# Patient Record
Sex: Female | Born: 1960 | Race: Black or African American | Hispanic: No | Marital: Married | Smoking: Current every day smoker
Health system: Southern US, Community
[De-identification: ages and names within clinical notes are randomized; demographics above are authoritative.]

## PROBLEM LIST (undated history)

## (undated) DIAGNOSIS — E119 Type 2 diabetes mellitus without complications: Secondary | ICD-10-CM

## (undated) HISTORY — PX: ABDOMINAL HYSTERECTOMY: SHX81

## (undated) HISTORY — PX: LIVER BIOPSY: SHX301

---

## 2005-08-10 ENCOUNTER — Ambulatory Visit: Payer: Self-pay | Admitting: Gastroenterology

## 2005-09-09 ENCOUNTER — Ambulatory Visit (HOSPITAL_COMMUNITY): Admission: RE | Admit: 2005-09-09 | Discharge: 2005-09-09 | Payer: Self-pay | Admitting: Gastroenterology

## 2006-09-08 ENCOUNTER — Ambulatory Visit: Payer: Self-pay | Admitting: Gastroenterology

## 2006-09-19 ENCOUNTER — Ambulatory Visit (HOSPITAL_COMMUNITY): Admission: RE | Admit: 2006-09-19 | Discharge: 2006-09-19 | Payer: Self-pay | Admitting: Gastroenterology

## 2013-03-20 ENCOUNTER — Emergency Department (HOSPITAL_BASED_OUTPATIENT_CLINIC_OR_DEPARTMENT_OTHER): Payer: No Typology Code available for payment source

## 2013-03-20 ENCOUNTER — Emergency Department (HOSPITAL_BASED_OUTPATIENT_CLINIC_OR_DEPARTMENT_OTHER)
Admission: EM | Admit: 2013-03-20 | Discharge: 2013-03-20 | Disposition: A | Discharge status: Home / Self Care | Payer: No Typology Code available for payment source | Attending: Emergency Medicine | Admitting: Emergency Medicine

## 2013-03-20 ENCOUNTER — Encounter (HOSPITAL_BASED_OUTPATIENT_CLINIC_OR_DEPARTMENT_OTHER): Payer: Self-pay | Admitting: *Deleted

## 2013-03-20 DIAGNOSIS — Y92410 Unspecified street and highway as the place of occurrence of the external cause: Secondary | ICD-10-CM | POA: Insufficient documentation

## 2013-03-20 DIAGNOSIS — S139XXA Sprain of joints and ligaments of unspecified parts of neck, initial encounter: Secondary | ICD-10-CM | POA: Insufficient documentation

## 2013-03-20 DIAGNOSIS — Z794 Long term (current) use of insulin: Secondary | ICD-10-CM | POA: Insufficient documentation

## 2013-03-20 DIAGNOSIS — Z79899 Other long term (current) drug therapy: Secondary | ICD-10-CM | POA: Insufficient documentation

## 2013-03-20 DIAGNOSIS — S46911A Strain of unspecified muscle, fascia and tendon at shoulder and upper arm level, right arm, initial encounter: Secondary | ICD-10-CM

## 2013-03-20 DIAGNOSIS — IMO0002 Reserved for concepts with insufficient information to code with codable children: Secondary | ICD-10-CM | POA: Insufficient documentation

## 2013-03-20 DIAGNOSIS — Y9389 Activity, other specified: Secondary | ICD-10-CM | POA: Insufficient documentation

## 2013-03-20 DIAGNOSIS — E119 Type 2 diabetes mellitus without complications: Secondary | ICD-10-CM | POA: Insufficient documentation

## 2013-03-20 DIAGNOSIS — S161XXA Strain of muscle, fascia and tendon at neck level, initial encounter: Secondary | ICD-10-CM

## 2013-03-20 DIAGNOSIS — F17200 Nicotine dependence, unspecified, uncomplicated: Secondary | ICD-10-CM | POA: Insufficient documentation

## 2013-03-20 HISTORY — DX: Type 2 diabetes mellitus without complications: E11.9

## 2013-03-20 MED ORDER — OXYCODONE-ACETAMINOPHEN 5-325 MG PO TABS: 1.0000 | ORAL_TABLET | Freq: Four times a day (QID) | ORAL | Status: DC | PRN

## 2013-03-20 MED ORDER — DIAZEPAM 5 MG PO TABS: 5.0000 mg | ORAL_TABLET | Freq: Four times a day (QID) | ORAL | Status: DC | PRN

## 2013-03-20 MED ORDER — IBUPROFEN 600 MG PO TABS: 600.0000 mg | ORAL_TABLET | Freq: Four times a day (QID) | ORAL | Status: DC | PRN

## 2013-03-20 MED ORDER — OXYCODONE-ACETAMINOPHEN 5-325 MG PO TABS
1.0000 | ORAL_TABLET | Freq: Once | ORAL | Status: AC
  Administered 2013-03-20: 1 via ORAL
  Filled 2013-03-20 (×2): qty 1
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
## 2013-06-04 ENCOUNTER — Emergency Department (HOSPITAL_BASED_OUTPATIENT_CLINIC_OR_DEPARTMENT_OTHER)
Admission: EM | Admit: 2013-06-04 | Discharge: 2013-06-04 | Disposition: A | Discharge status: Home / Self Care | Payer: Medicaid Other | Attending: Emergency Medicine | Admitting: Emergency Medicine

## 2013-06-04 ENCOUNTER — Emergency Department (HOSPITAL_BASED_OUTPATIENT_CLINIC_OR_DEPARTMENT_OTHER): Payer: Medicaid Other

## 2013-06-04 DIAGNOSIS — E119 Type 2 diabetes mellitus without complications: Secondary | ICD-10-CM | POA: Insufficient documentation

## 2013-06-04 DIAGNOSIS — Z79899 Other long term (current) drug therapy: Secondary | ICD-10-CM | POA: Insufficient documentation

## 2013-06-04 DIAGNOSIS — R109 Unspecified abdominal pain: Secondary | ICD-10-CM | POA: Insufficient documentation

## 2013-06-04 DIAGNOSIS — F17200 Nicotine dependence, unspecified, uncomplicated: Secondary | ICD-10-CM | POA: Insufficient documentation

## 2013-06-04 DIAGNOSIS — Z794 Long term (current) use of insulin: Secondary | ICD-10-CM | POA: Insufficient documentation

## 2013-06-04 DIAGNOSIS — Z8619 Personal history of other infectious and parasitic diseases: Secondary | ICD-10-CM | POA: Insufficient documentation

## 2013-06-04 LAB — COMPREHENSIVE METABOLIC PANEL
ALT: 228 U/L — ABNORMAL HIGH (ref 0–35)
AST: 144 U/L — ABNORMAL HIGH (ref 0–37)
Albumin: 2.8 g/dL — ABNORMAL LOW (ref 3.5–5.2)
Alkaline Phosphatase: 180 U/L — ABNORMAL HIGH (ref 39–117)
CO2: 19 mEq/L (ref 19–32)
Calcium: 9.8 mg/dL (ref 8.4–10.5)
Chloride: 101 mEq/L (ref 96–112)
Creatinine, Ser: 0.7 mg/dL (ref 0.50–1.10)
GFR calc Af Amer: >90 mL/min (ref >90)
GFR calc non Af Amer: >90 mL/min (ref >90)
Glucose, Bld: 269 mg/dL — ABNORMAL HIGH (ref 70–99)
Potassium: 3.8 mEq/L (ref 3.5–5.1)
Sodium: 136 mEq/L (ref 135–145)
Total Bilirubin: 0.6 mg/dL (ref 0.3–1.2)
Total Protein: 9 g/dL — ABNORMAL HIGH (ref 6.0–8.3)

## 2013-06-04 LAB — CBC WITH DIFFERENTIAL/PLATELET
Basophils Absolute: 0.1 10*3/uL (ref 0.0–0.1)
Basophils Relative: 1 % (ref 0–1)
Eosinophils Absolute: 0.4 10*3/uL (ref 0.0–0.7)
Eosinophils Relative: 6 % — ABNORMAL HIGH (ref 0–5)
HCT: 35.9 % — ABNORMAL LOW (ref 36.0–46.0)
Hemoglobin: 12.5 g/dL (ref 12.0–15.0)
Lymphocytes Relative: 48 % — ABNORMAL HIGH (ref 12–46)
Lymphs Abs: 3.3 10*3/uL (ref 0.7–4.0)
MCH: 32.3 pg (ref 26.0–34.0)
MCV: 92.8 fL (ref 78.0–100.0)
Monocytes Absolute: 0.8 10*3/uL (ref 0.1–1.0)
Monocytes Relative: 11 % (ref 3–12)
Neutro Abs: 2.4 10*3/uL (ref 1.7–7.7)
Neutrophils Relative %: 34 % — ABNORMAL LOW (ref 43–77)
Platelets: 132 10*3/uL — ABNORMAL LOW (ref 150–400)
RBC: 3.87 MIL/uL (ref 3.87–5.11)
RDW: 13.6 % (ref 11.5–15.5)
WBC: 7 10*3/uL (ref 4.0–10.5)

## 2013-06-04 LAB — LIPASE, BLOOD: Lipase: 38 U/L (ref 11–59)

## 2013-06-04 LAB — URINE MICROSCOPIC-ADD ON

## 2013-06-04 LAB — URINALYSIS, ROUTINE W REFLEX MICROSCOPIC
Bilirubin Urine: NEGATIVE
Glucose, UA: >1000 mg/dL — AB
Hgb urine dipstick: NEGATIVE
Ketones, ur: NEGATIVE mg/dL
Nitrite: NEGATIVE
Protein, ur: NEGATIVE mg/dL
Specific Gravity, Urine: 1.023 (ref 1.005–1.030)
Urobilinogen, UA: 1 mg/dL (ref 0.0–1.0)

## 2013-06-04 LAB — PROTIME-INR: INR: 1.12 (ref 0.00–1.49)

## 2013-06-04 MED ORDER — HYDROMORPHONE HCL PF 2 MG/ML IJ SOLN
INTRAMUSCULAR | Status: AC
Start: 2013-06-04 — End: 2013-06-04
  Administered 2013-06-04: 2 mg via INTRAVENOUS
  Filled 2013-06-04: qty 1

## 2013-06-04 MED ORDER — HYDROMORPHONE HCL PF 2 MG/ML IJ SOLN
2.0000 mg | Freq: Once | INTRAMUSCULAR | Status: AC
  Administered 2013-06-04: 2 mg via INTRAVENOUS

## 2013-06-04 MED ORDER — ONDANSETRON HCL 4 MG/2ML IJ SOLN
4.0000 mg | Freq: Once | INTRAMUSCULAR | Status: AC
  Administered 2013-06-04: 4 mg via INTRAVENOUS

## 2013-06-04 MED ORDER — ONDANSETRON HCL 4 MG/2ML IJ SOLN
INTRAMUSCULAR | Status: AC
  Administered 2013-06-04: 4 mg via INTRAVENOUS
  Filled 2013-06-04: qty 2

## 2013-06-04 NOTE — ED Provider Notes (Signed)
CSN: [id number]     Arrival date & time 06/04/13  0700 History     First MD Initiated Contact with Patient 06/04/13 0703     Chief Complaint  Patient presents with  . Flank Pain   (Consider location/radiation/quality/duration/timing/severity/associated sxs/prior Treatment) HPI Complains of right flank pain nonradiating onset 4 AM today. Patient has never had similar pain before. No other associated symptoms. Pain is sharp and severe.  No nausea no vomiting no treatment prior to coming here nothing makes pain better or worse. No treatment prior to coming here brought by EMS. Last ate 5 AM today, after onset of pain. Last bowel movement yesterday, normal. No other associated symptoms. Past Medical History  Diagnosis Date  . Diabetes mellitus without complication   Hepatitis C Past Surgical History  Procedure Laterality Date  . Abdominal hysterectomy    . Liver biopsy     No family history on file. History  Substance Use Topics  . Smoking status: Current Every Day Smoker -- 0.50 packs/day for 25 years    Types: Cigarettes  . Smokeless tobacco: Not on file  . Alcohol Use: Yes   no illicit drug use OB History   Grav Para Term Preterm Abortions TAB SAB Ect Mult Living                 Review of Systems  Constitutional: Negative.   HENT: Negative.   Respiratory: Negative.   Cardiovascular: Negative.   Gastrointestinal: Negative.   Genitourinary: Positive for flank pain.  Musculoskeletal: Negative.   Skin: Negative.   Allergic/Immunologic: Positive for immunocompromised state.       Diabetic  Neurological: Negative.   Psychiatric/Behavioral: Negative.   All other systems reviewed and are negative.    Allergies  Ivp dye  Home Medications   Current Outpatient Rx  Name  Route  Sig  Dispense  Refill  . diazepam (VALIUM) 5 MG tablet   Oral   Take 1 tablet (5 mg total) by mouth every 6 (six) hours as needed (spasm).   10 tablet   0   . ibuprofen (ADVIL,MOTRIN) 600 MG  tablet   Oral   Take 1 tablet (600 mg total) by mouth every 6 (six) hours as needed for pain.   20 tablet   0   . insulin NPH-regular (NOVOLIN 70/30) (70-30) 100 UNIT/ML injection   Subcutaneous   Inject 40 Units into the skin daily with breakfast.         . metFORMIN (GLUCOPHAGE) 1000 MG tablet   Oral   Take 1,000 mg by mouth 3 (three) times daily.         Marland Kitchen oxyCODONE-acetaminophen (PERCOCET/ROXICET) 5-325 MG per tablet   Oral   Take 1-2 tablets by mouth every 6 (six) hours as needed for pain.   10 tablet   0    BP 126/65  Temp(Src) 98.1 F (36.7 C) (Oral)  Resp 23  Ht 5\' 3"  (1.6 m)  Wt 136 lb (61.689 kg)  BMI 24.1 kg/m2  SpO2 100% Physical Exam  Nursing note and vitals reviewed. Constitutional: She is oriented to person, place, and time. She appears well-developed and well-nourished. She appears distressed.  Severe distress appears uncomfortable alert Glasgow Coma Score 15  HENT:  Head: Normocephalic and atraumatic.  Eyes: Conjunctivae are normal. Pupils are equal, round, and reactive to light.  Neck: Neck supple. No tracheal deviation present. No thyromegaly present.  Cardiovascular: Normal rate and regular rhythm.   No murmur heard. Pulmonary/Chest: Effort  normal and breath sounds normal.  Abdominal: Soft. Bowel sounds are normal. She exhibits no distension. There is no tenderness.  Genitourinary:  Right flank tenderness  Musculoskeletal: Normal range of motion. She exhibits no edema and no tenderness.  Neurological: She is alert and oriented to person, place, and time. Coordination normal.  Skin: Skin is warm and dry. No rash noted.  Psychiatric: She has a normal mood and affect.    ED Course  7:20 AM is much improved after treatment with intravenous hydromorphone. Resting comfortably Procedures (including critical care time) 7:50 AM patient vomited. Zofran ordered. 7:55 AM patient states "I feel so much better." Labs Reviewed  COMPREHENSIVE METABOLIC  PANEL  URINALYSIS, ROUTINE W REFLEX MICROSCOPIC  CBC WITH DIFFERENTIAL    No results found. No diagnosis found. 8:40 AM spoke with patient's private physician Dr.Sun who states that patient had transaminases performed in his office may 2014 which hare essentially the same as today. Results for orders placed during the hospital encounter of 06/04/13  COMPREHENSIVE METABOLIC PANEL      Result Value Range   Sodium 136  135 - 145 mEq/L   Potassium 3.8  3.5 - 5.1 mEq/L   Chloride 101  96 - 112 mEq/L   CO2 19  19 - 32 mEq/L   Glucose, Bld 269 (*) 70 - 99 mg/dL   BUN 11  6 - 23 mg/dL   Creatinine, Ser 1.02  0.50 - 1.10 mg/dL   Calcium 9.8  8.4 - 72.5 mg/dL   Total Protein 9.0 (*) 6.0 - 8.3 g/dL   Albumin 2.8 (*) 3.5 - 5.2 g/dL   AST 366 (*) 0 - 37 U/L   ALT 228 (*) 0 - 35 U/L   Alkaline Phosphatase 180 (*) 39 - 117 U/L   Total Bilirubin 0.6  0.3 - 1.2 mg/dL   GFR calc non Af Amer >90  >90 mL/min   GFR calc Af Amer >90  >90 mL/min  URINALYSIS, ROUTINE W REFLEX MICROSCOPIC      Result Value Range   Color, Urine YELLOW  YELLOW   APPearance CLEAR  CLEAR   Specific Gravity, Urine 1.023  1.005 - 1.030   pH 5.5  5.0 - 8.0   Glucose, UA >1000 (*) NEGATIVE mg/dL   Hgb urine dipstick NEGATIVE  NEGATIVE   Bilirubin Urine NEGATIVE  NEGATIVE   Ketones, ur NEGATIVE  NEGATIVE mg/dL   Protein, ur NEGATIVE  NEGATIVE mg/dL   Urobilinogen, UA 1.0  0.0 - 1.0 mg/dL   Nitrite NEGATIVE  NEGATIVE   Leukocytes, UA NEGATIVE  NEGATIVE  CBC WITH DIFFERENTIAL      Result Value Range   WBC 7.0  4.0 - 10.5 K/uL   RBC 3.87  3.87 - 5.11 MIL/uL   Hemoglobin 12.5  12.0 - 15.0 g/dL   HCT 44.0 (*) 34.7 - 42.5 %   MCV 92.8  78.0 - 100.0 fL   MCH 32.3  26.0 - 34.0 pg   MCHC 34.8  30.0 - 36.0 g/dL   RDW 95.6  38.7 - 56.4 %   Platelets 132 (*) 150 - 400 K/uL   Neutrophils Relative % 34 (*) 43 - 77 %   Neutro Abs 2.4  1.7 - 7.7 K/uL   Lymphocytes Relative 48 (*) 12 - 46 %   Lymphs Abs 3.3  0.7 - 4.0 K/uL    Monocytes Relative 11  3 - 12 %   Monocytes Absolute 0.8  0.1 - 1.0 K/uL  Eosinophils Relative 6 (*) 0 - 5 %   Eosinophils Absolute 0.4  0.0 - 0.7 K/uL   Basophils Relative 1  0 - 1 %   Basophils Absolute 0.1  0.0 - 0.1 K/uL  LIPASE, BLOOD      Result Value Range   Lipase 38  11 - 59 U/L  PROTIME-INR      Result Value Range   Prothrombin Time 14.2  11.6 - 15.2 seconds   INR 1.12  0.00 - 1.49  URINE MICROSCOPIC-ADD ON      Result Value Range   Squamous Epithelial / LPF RARE  RARE   WBC, UA 0-2  <3 WBC/hpf   RBC / HPF 0-2  <3 RBC/hpf   Bacteria, UA FEW (*) RARE   Ct Abdomen Pelvis Wo Contrast  06/04/2013   *RADIOLOGY REPORT*  Clinical Data: Right-sided flank pain  CT ABDOMEN AND PELVIS WITHOUT CONTRAST  Technique:  Multidetector CT imaging of the abdomen and pelvis was performed following the standard protocol without intravenous contrast.  Comparison: Abdominal MRI 09/19/06  Findings: Lung bases are clear.  Sludge calcific opacity within both kidneys in the region of the pharynx or collecting system could indicate minimal nephrocalcinosis or nonobstructing calculi below the resolution of CT, given that no contrast has reportedly been administered to otherwise account for the appearance.  Unenhanced abdominal viscera otherwise unremarkable.  Moderate atheromatous aortic calcification without aneurysm.  No radiopaque ureteral calculus.  Within the bladder dependently, image 57, is a 1 mm calcific opacity.  The bladder is otherwise unremarkable.  Appendix is normal.  No bowel wall thickening or focal segmental dilatation.  No free fluid or air.  No acute osseous abnormality. L5-S1 disc degenerative change noted.  IMPRESSION: Bilateral 1 mm nonobstructing renal calculi versus minimal nephrocalcinosis. No dilatation of either renal or ureteral collecting system.  1 mm calcific opacity dependently within the bladder is identified which could represent a recently passed stone.  Given its small size,  straining the urine may be helpful for confirmation; prone repeat imaging through the pelvis could confirm a bladder calculus but may not be necessary likely given its small size and likely spontaneous resolution.   Original Report Authenticated By: Christiana Pellant, M.D.   915 am feels ready to go home MDM  Ureteral colic is a distinct possibility from patient's initial presentation. Transaminases are approximate baseline. Plan followup Dr. Wynelle Link this week Diagnosis #1 right flank pain #2 elevated LFTs #3 hyperglycemia  Doug Sou, MD 06/04/13 1610

## 2013-06-04 NOTE — ED Notes (Signed)
Called Dr. Wynelle Link at [facility] direct for Dr. Ethelda Chick at [phone]

## 2013-06-04 NOTE — ED Notes (Signed)
Pt had gradual onset Left flank and abd pain onset at 0400 worsen at this time denies injury or other event

## 2013-06-08 ENCOUNTER — Encounter (HOSPITAL_BASED_OUTPATIENT_CLINIC_OR_DEPARTMENT_OTHER): Payer: Self-pay | Admitting: *Deleted

## 2013-06-08 ENCOUNTER — Emergency Department (HOSPITAL_BASED_OUTPATIENT_CLINIC_OR_DEPARTMENT_OTHER)
Admission: EM | Admit: 2013-06-08 | Discharge: 2013-06-09 | Disposition: A | Discharge status: Home / Self Care | Payer: Medicaid Other | Attending: Emergency Medicine | Admitting: Emergency Medicine

## 2013-06-08 DIAGNOSIS — F17200 Nicotine dependence, unspecified, uncomplicated: Secondary | ICD-10-CM | POA: Insufficient documentation

## 2013-06-08 DIAGNOSIS — R1011 Right upper quadrant pain: Secondary | ICD-10-CM

## 2013-06-08 DIAGNOSIS — E119 Type 2 diabetes mellitus without complications: Secondary | ICD-10-CM | POA: Insufficient documentation

## 2013-06-08 DIAGNOSIS — R112 Nausea with vomiting, unspecified: Secondary | ICD-10-CM | POA: Insufficient documentation

## 2013-06-08 DIAGNOSIS — R110 Nausea: Secondary | ICD-10-CM

## 2013-06-08 DIAGNOSIS — R748 Abnormal levels of other serum enzymes: Secondary | ICD-10-CM

## 2013-06-08 DIAGNOSIS — Z79899 Other long term (current) drug therapy: Secondary | ICD-10-CM | POA: Insufficient documentation

## 2013-06-08 LAB — CBC WITH DIFFERENTIAL/PLATELET
Basophils Absolute: 0 10*3/uL (ref 0.0–0.1)
Basophils Relative: 1 % (ref 0–1)
Eosinophils Absolute: 0.6 10*3/uL (ref 0.0–0.7)
Eosinophils Relative: 7 % — ABNORMAL HIGH (ref 0–5)
HCT: 38 % (ref 36.0–46.0)
Hemoglobin: 13.2 g/dL (ref 12.0–15.0)
Lymphocytes Relative: 52 % — ABNORMAL HIGH (ref 12–46)
Lymphs Abs: 4.1 10*3/uL — ABNORMAL HIGH (ref 0.7–4.0)
MCH: 32.3 pg (ref 26.0–34.0)
MCHC: 34.7 g/dL (ref 30.0–36.0)
MCV: 92.9 fL (ref 78.0–100.0)
Monocytes Absolute: 0.8 10*3/uL (ref 0.1–1.0)
Monocytes Relative: 11 % (ref 3–12)
Neutro Abs: 2.3 10*3/uL (ref 1.7–7.7)
Neutrophils Relative %: 30 % — ABNORMAL LOW (ref 43–77)
Platelets: 112 K/uL — ABNORMAL LOW (ref 150–400)
RBC: 4.09 MIL/uL (ref 3.87–5.11)
RDW: 13.5 % (ref 11.5–15.5)
WBC: 7.9 10*3/uL (ref 4.0–10.5)

## 2013-06-08 LAB — COMPREHENSIVE METABOLIC PANEL
ALT: 219 U/L — ABNORMAL HIGH (ref 0–35)
Albumin: 2.8 g/dL — ABNORMAL LOW (ref 3.5–5.2)
Alkaline Phosphatase: 165 U/L — ABNORMAL HIGH (ref 39–117)
BUN: 11 mg/dL (ref 6–23)
CO2: 24 mEq/L (ref 19–32)
Calcium: 9.6 mg/dL (ref 8.4–10.5)
Creatinine, Ser: 0.7 mg/dL (ref 0.50–1.10)
GFR calc Af Amer: >90 mL/min (ref >90)
GFR calc non Af Amer: >90 mL/min (ref >90)
Glucose, Bld: 140 mg/dL — ABNORMAL HIGH (ref 70–99)
Potassium: 3.6 mEq/L (ref 3.5–5.1)
Sodium: 136 mEq/L (ref 135–145)
Total Bilirubin: 0.7 mg/dL (ref 0.3–1.2)
Total Protein: 9.1 g/dL — ABNORMAL HIGH (ref 6.0–8.3)

## 2013-06-08 LAB — URINE MICROSCOPIC-ADD ON

## 2013-06-08 LAB — URINALYSIS, ROUTINE W REFLEX MICROSCOPIC
Bilirubin Urine: NEGATIVE
Glucose, UA: NEGATIVE mg/dL
Hgb urine dipstick: NEGATIVE
Ketones, ur: NEGATIVE mg/dL
Nitrite: NEGATIVE
Protein, ur: NEGATIVE mg/dL
Specific Gravity, Urine: 1.016 (ref 1.005–1.030)
Urobilinogen, UA: 1 mg/dL (ref 0.0–1.0)
pH: 5.5 (ref 5.0–8.0)

## 2013-06-08 LAB — COMPREHENSIVE METABOLIC PANEL WITH GFR
AST: 124 U/L — ABNORMAL HIGH (ref 0–37)
Chloride: 103 meq/L (ref 96–112)

## 2013-06-08 LAB — LIPASE, BLOOD: Lipase: 21 U/L (ref 11–59)

## 2013-06-08 MED ORDER — ONDANSETRON HCL 4 MG/2ML IJ SOLN
4.0000 mg | Freq: Once | INTRAMUSCULAR | Status: AC
Start: 2013-06-08 — End: 2013-06-08
  Administered 2013-06-08: 4 mg via INTRAVENOUS
  Filled 2013-06-08: qty 2

## 2013-06-08 MED ORDER — SODIUM CHLORIDE 0.9 % IV SOLN
Freq: Once | INTRAVENOUS | Status: AC
  Administered 2013-06-08: 23:00:00 via INTRAVENOUS

## 2013-06-08 MED ORDER — HYDROMORPHONE HCL PF 1 MG/ML IJ SOLN
1.0000 mg | Freq: Once | INTRAMUSCULAR | Status: AC
  Administered 2013-06-08: 1 mg via INTRAVENOUS
  Filled 2013-06-08: qty 1

## 2013-06-08 MED ORDER — OXYCODONE HCL 5 MG PO TABS: 5.0000 mg | ORAL_TABLET | ORAL | Status: DC | PRN

## 2013-06-08 MED ORDER — ONDANSETRON 8 MG PO TBDP: 8.0000 mg | ORAL_TABLET | Freq: Two times a day (BID) | ORAL | Status: DC | PRN

## 2013-06-08 NOTE — Discharge Instructions (Signed)
 Abdominal Pain Abdominal pain can be caused by many things. Your caregiver decides the seriousness of your pain by an examination and possibly blood tests and X-rays. Many cases can be observed and treated at home. Most abdominal pain is not caused by a disease and will probably improve without treatment. However, in many cases, more time must pass before a clear cause of the pain can be found. Before that point, it may not be known if you need more testing, or if hospitalization or surgery is needed. HOME CARE INSTRUCTIONS   Do not take laxatives unless directed by your caregiver.  Take pain medicine only as directed by your caregiver.  Only take over-the-counter or prescription medicines for pain, discomfort, or fever as directed by your caregiver.  Try a clear liquid diet (broth, tea, or water) for as long as directed by your caregiver. Slowly move to a bland diet as tolerated. SEEK IMMEDIATE MEDICAL CARE IF:   The pain does not go away.  You have a fever.  You keep throwing up (vomiting).  The pain is felt only in portions of the abdomen. Pain in the right side could possibly be appendicitis. In an adult, pain in the left lower portion of the abdomen could be colitis or diverticulitis.  You pass bloody or black tarry stools. MAKE SURE YOU:   Understand these instructions.  Will watch your condition.  Will get help right away if you are not doing well or get worse. Document Released: 08/04/2005 Document Revised: 01/17/2012 Document Reviewed: 06/12/2008 [facility] Patient Information 2014 [place], [place].     Narcotic and benzodiazepine use may cause drowsiness, slowed breathing or dependence.  Please use with caution and do not drive, operate machinery or watch young children alone while taking them.  Taking combinations of these medications or drinking alcohol will potentiate these effects.

## 2013-06-08 NOTE — ED Provider Notes (Signed)
CSN: [id number]     Arrival date & time 06/08/13  2150 History     First MD Initiated Contact with Patient 06/08/13 2216     Chief Complaint  Patient presents with  . Abdominal Pain   (Consider location/radiation/quality/duration/timing/severity/associated sxs/prior Treatment) HPI Comments: Pt was woken on Monday morning at 4 AM with right flank pain, N/V.  She was seen in the ED and had labs, CT scan which shoed a passed stone in bladder.  Pt has h/o hep C, has chronic elevated enzymes, but otherwise stable. She is followed by [facility].  She was discharged with no meds because of CT result and pt was feeling improved by notes, although pt reports today that she didn't feel much better.   She followed up in clinic the next day, given Rx for percocet and diltizem (unsure why diltiazem) and percocet helps only mildly.  Nausea improved, but now pt has vomited again today, pain still hasn't gone away, has moved slightly to bottom of right flank and rib.  No pleurisy, no cough, no fevers, chills.  No difficulty urinated.  No skin rash, itching.  She also had an U/S in clinic, but unkown what the results here.    Patient is a 52 y.o. female presenting with abdominal pain. The history is provided by the patient, the spouse and medical records.  Abdominal Pain This is a new problem. The current episode started more than 2 days ago. The problem occurs constantly. The problem has not changed since onset.Associated symptoms include abdominal pain. Pertinent negatives include no chest pain and no shortness of breath. The symptoms are aggravated by eating. The symptoms are relieved by medications.    Past Medical History  Diagnosis Date  . Diabetes mellitus without complication    Past Surgical History  Procedure Laterality Date  . Abdominal hysterectomy    . Liver biopsy     No family history on file. History  Substance Use Topics  . Smoking status: Current Every Day Smoker -- 0.50 packs/day  for 25 years    Types: Cigarettes  . Smokeless tobacco: Not on file  . Alcohol Use: Yes   OB History   Grav Para Term Preterm Abortions TAB SAB Ect Mult Living                 Review of Systems  Constitutional: Negative for fever and chills.  Respiratory: Negative for cough and shortness of breath.   Cardiovascular: Negative for chest pain.  Gastrointestinal: Positive for nausea, vomiting and abdominal pain. Negative for diarrhea and constipation.  Musculoskeletal: Negative for back pain.  Skin: Negative for color change and rash.  All other systems reviewed and are negative.    Allergies  Ivp dye  Home Medications   Current Outpatient Rx  Name  Route  Sig  Dispense  Refill  . diazepam (VALIUM) 5 MG tablet   Oral   Take 1 tablet (5 mg total) by mouth every 6 (six) hours as needed (spasm).   10 tablet   0   . ibuprofen (ADVIL,MOTRIN) 600 MG tablet   Oral   Take 1 tablet (600 mg total) by mouth every 6 (six) hours as needed for pain.   20 tablet   0   . insulin NPH-regular (NOVOLIN 70/30) (70-30) 100 UNIT/ML injection   Subcutaneous   Inject 40 Units into the skin daily with breakfast.         . metFORMIN (GLUCOPHAGE) 1000 MG tablet   Oral  Take 1,000 mg by mouth 3 (three) times daily.         . ondansetron (ZOFRAN-ODT) 8 MG disintegrating tablet   Oral   Take 1 tablet (8 mg total) by mouth every 12 (twelve) hours as needed for nausea.   20 tablet   0   . oxyCODONE (OXY IR/ROXICODONE) 5 MG immediate release tablet   Oral   Take 1 tablet (5 mg total) by mouth every 4 (four) hours as needed for pain.   20 tablet   0   . oxyCODONE-acetaminophen (PERCOCET/ROXICET) 5-325 MG per tablet   Oral   Take 1-2 tablets by mouth every 6 (six) hours as needed for pain.   10 tablet   0    BP 123/70  Pulse 71  Temp(Src) 98.5 F (36.9 C) (Oral)  Resp 18  Ht 5\' 3"  (1.6 m)  Wt 136 lb (61.689 kg)  BMI 24.1 kg/m2  SpO2 100% Physical Exam  Nursing note and  vitals reviewed. Constitutional: She appears well-developed and well-nourished.  HENT:  Head: Normocephalic and atraumatic.  Eyes: Conjunctivae and EOM are normal. No scleral icterus.  Neck: Normal range of motion. Neck supple.  Cardiovascular: Normal rate, regular rhythm and intact distal pulses.   No murmur heard. Pulmonary/Chest: Effort normal. She has no wheezes. She has no rales.      Abdominal: Soft. Normal appearance and bowel sounds are normal. She exhibits no shifting dullness, no distension and no mass. There is tenderness in the right upper quadrant. There is CVA tenderness. There is no rebound, no guarding, no tenderness at McBurney's point and negative Murphy's sign.  Neurological: She is alert. She exhibits abnormal muscle tone. Coordination normal.  Skin: Skin is warm. No rash noted. No pallor.  Psychiatric: She has a normal mood and affect.    ED Course   Procedures (including critical care time)  Labs Reviewed  URINALYSIS, ROUTINE W REFLEX MICROSCOPIC - Abnormal; Notable for the following:    Leukocytes, UA SMALL (*)    All other components within normal limits  CBC WITH DIFFERENTIAL - Abnormal; Notable for the following:    Platelets 112 (*)    Neutrophils Relative % 30 (*)    Lymphocytes Relative 52 (*)    Lymphs Abs 4.1 (*)    Eosinophils Relative 7 (*)    All other components within normal limits  COMPREHENSIVE METABOLIC PANEL - Abnormal; Notable for the following:    Glucose, Bld 140 (*)    Total Protein 9.1 (*)    Albumin 2.8 (*)    AST 124 (*)    ALT 219 (*)    Alkaline Phosphatase 165 (*)    All other components within normal limits  URINE MICROSCOPIC-ADD ON  LIPASE, BLOOD   No results found. 1. Right upper quadrant abdominal pain   2. Elevated liver enzymes   3. Nausea    RA sat is 100% and I interpret to be normal   11:50 PM Pt feels improved, no nausea, pain is better.  Pt has about 15 tablets she thinks left of percoet.  She follows  up with PCP on Tuesday.  I have encoaurged to follow up with PCP and keep appt.  Will add nasuea meds as Rx.  This may be chronic hepatitis with acute flare causing symptoms.  Discussed return precautsions with family and pt.  They are agreeable with plan.  Labs are not worse than last ED visit.    MDM  Pt is not toxic  appearing, stable vitals, no fever.  Pt with pain, vomiting, will treat with IV meds.  Pt had recent CT and U/S.  I don't think we should repeat unless labs are markedly changed and worse compared to recent.  Not a surgical abdomen on exam.  No symptoms or signs to suggest pneumonia, PE.  Pain is very reproducible on exam, but no rash.    Gavin Pound. Colten Desroches, MD 06/08/13 2350

## 2013-06-08 NOTE — ED Notes (Signed)
Right flank pain x 1 week. Was seen earlier in the week for same and told she may have passed a kidney stone.

## 2015-03-02 ENCOUNTER — Emergency Department (HOSPITAL_BASED_OUTPATIENT_CLINIC_OR_DEPARTMENT_OTHER)
Admission: EM | Admit: 2015-03-02 | Discharge: 2015-03-02 | Disposition: A | Discharge status: Home / Self Care | Payer: Self-pay | Attending: Emergency Medicine | Admitting: Emergency Medicine

## 2015-03-02 ENCOUNTER — Encounter (HOSPITAL_BASED_OUTPATIENT_CLINIC_OR_DEPARTMENT_OTHER): Payer: Self-pay

## 2015-03-02 ENCOUNTER — Emergency Department (HOSPITAL_BASED_OUTPATIENT_CLINIC_OR_DEPARTMENT_OTHER): Payer: Medicaid Other

## 2015-03-02 DIAGNOSIS — R531 Weakness: Secondary | ICD-10-CM

## 2015-03-02 DIAGNOSIS — R630 Anorexia: Secondary | ICD-10-CM | POA: Insufficient documentation

## 2015-03-02 DIAGNOSIS — Z79899 Other long term (current) drug therapy: Secondary | ICD-10-CM | POA: Insufficient documentation

## 2015-03-02 DIAGNOSIS — N390 Urinary tract infection, site not specified: Secondary | ICD-10-CM | POA: Insufficient documentation

## 2015-03-02 DIAGNOSIS — Z720 Tobacco use: Secondary | ICD-10-CM | POA: Insufficient documentation

## 2015-03-02 DIAGNOSIS — R634 Abnormal weight loss: Secondary | ICD-10-CM | POA: Insufficient documentation

## 2015-03-02 DIAGNOSIS — R5383 Other fatigue: Secondary | ICD-10-CM | POA: Insufficient documentation

## 2015-03-02 DIAGNOSIS — Z794 Long term (current) use of insulin: Secondary | ICD-10-CM | POA: Insufficient documentation

## 2015-03-02 DIAGNOSIS — E119 Type 2 diabetes mellitus without complications: Secondary | ICD-10-CM | POA: Insufficient documentation

## 2015-03-02 DIAGNOSIS — R109 Unspecified abdominal pain: Secondary | ICD-10-CM

## 2015-03-02 LAB — COMPREHENSIVE METABOLIC PANEL
ALT: 127 U/L — ABNORMAL HIGH (ref 0–35)
AST: 89 U/L — ABNORMAL HIGH (ref 0–37)
Albumin: 3.2 g/dL — ABNORMAL LOW (ref 3.5–5.2)
Alkaline Phosphatase: 201 U/L — ABNORMAL HIGH (ref 39–117)
Anion gap: 6 (ref 5–15)
BUN: 13 mg/dL (ref 6–23)
CALCIUM: 9 mg/dL (ref 8.4–10.5)
CO2: 27 mmol/L (ref 19–32)
CREATININE: 1.02 mg/dL (ref 0.50–1.10)
Chloride: 100 mmol/L (ref 96–112)
GFR calc Af Amer: 70 mL/min — ABNORMAL LOW (ref >90)
GFR calc non Af Amer: 60 mL/min — ABNORMAL LOW (ref >90)
Glucose, Bld: 352 mg/dL — ABNORMAL HIGH (ref 70–99)
POTASSIUM: 3.8 mmol/L (ref 3.5–5.1)
Sodium: 133 mmol/L — ABNORMAL LOW (ref 135–145)
TOTAL PROTEIN: 9.3 g/dL — AB (ref 6.0–8.3)
Total Bilirubin: 1.2 mg/dL (ref 0.3–1.2)

## 2015-03-02 LAB — URINE MICROSCOPIC-ADD ON

## 2015-03-02 LAB — CBC WITH DIFFERENTIAL/PLATELET
Basophils Absolute: 0.1 10*3/uL (ref 0.0–0.1)
Basophils Relative: 1 % (ref 0–1)
EOS PCT: 9 % — AB (ref 0–5)
Eosinophils Absolute: 0.7 10*3/uL (ref 0.0–0.7)
HEMATOCRIT: 39.7 % (ref 36.0–46.0)
HEMOGLOBIN: 14 g/dL (ref 12.0–15.0)
Lymphocytes Relative: 49 % — ABNORMAL HIGH (ref 12–46)
Lymphs Abs: 4 10*3/uL (ref 0.7–4.0)
MCH: 31.7 pg (ref 26.0–34.0)
MCHC: 35.3 g/dL (ref 30.0–36.0)
MCV: 90 fL (ref 78.0–100.0)
MONO ABS: 0.8 10*3/uL (ref 0.1–1.0)
Monocytes Relative: 10 % (ref 3–12)
NEUTROS ABS: 2.5 10*3/uL (ref 1.7–7.7)
NEUTROS PCT: 31 % — AB (ref 43–77)
Platelets: 132 10*3/uL — ABNORMAL LOW (ref 150–400)
RBC: 4.41 MIL/uL (ref 3.87–5.11)
RDW: 12.9 % (ref 11.5–15.5)
WBC: 8.1 10*3/uL (ref 4.0–10.5)

## 2015-03-02 LAB — URINALYSIS, ROUTINE W REFLEX MICROSCOPIC
BILIRUBIN URINE: NEGATIVE
Glucose, UA: >1000 mg/dL — AB
Hgb urine dipstick: NEGATIVE
Ketones, ur: NEGATIVE mg/dL
Nitrite: NEGATIVE
PH: 6.5 (ref 5.0–8.0)
Protein, ur: NEGATIVE mg/dL
Specific Gravity, Urine: 1.018 (ref 1.005–1.030)
Urobilinogen, UA: 2 mg/dL — ABNORMAL HIGH (ref 0.0–1.0)

## 2015-03-02 LAB — CBG MONITORING, ED
Glucose-Capillary: 338 mg/dL — ABNORMAL HIGH (ref 70–99)
Glucose-Capillary: 301 mg/dL — ABNORMAL HIGH (ref 70–99)

## 2015-03-02 LAB — TROPONIN I

## 2015-03-02 LAB — LIPASE, BLOOD: LIPASE: 30 U/L (ref 11–59)

## 2015-03-02 MED ORDER — SODIUM CHLORIDE 0.9 % IV BOLUS (SEPSIS)
1000.0000 mL | Freq: Once | INTRAVENOUS | Status: AC
  Administered 2015-03-02: 1000 mL via INTRAVENOUS

## 2015-03-02 MED ORDER — CEPHALEXIN 500 MG PO CAPS: 500.0000 mg | ORAL_CAPSULE | Freq: Four times a day (QID) | ORAL | Status: AC

## 2015-03-02 MED ORDER — ONDANSETRON HCL 4 MG/2ML IJ SOLN
4.0000 mg | Freq: Once | INTRAMUSCULAR | Status: AC
  Administered 2015-03-02: 4 mg via INTRAVENOUS
  Filled 2015-03-02: qty 2

## 2015-03-02 NOTE — Discharge Instructions (Signed)
Abdominal Pain °Many things can cause abdominal pain. Usually, abdominal pain is not caused by a disease and will improve without treatment. It can often be observed and treated at home. Your health care provider will do a physical exam and possibly order blood tests and X-rays to help determine the seriousness of your pain. However, in many cases, more time must pass before a clear cause of the pain can be found. Before that point, your health care provider may not know if you need more testing or further treatment. °HOME CARE INSTRUCTIONS  °Monitor your abdominal pain for any changes. The following actions may help to alleviate any discomfort you are experiencing: °· Only take over-the-counter or prescription medicines as directed by your health care provider. °· Do not take laxatives unless directed to do so by your health care provider. °· Try a clear liquid diet (broth, tea, or water) as directed by your health care provider. Slowly move to a bland diet as tolerated. °SEEK MEDICAL CARE IF: °· You have unexplained abdominal pain. °· You have abdominal pain associated with nausea or diarrhea. °· You have pain when you urinate or have a bowel movement. °· You experience abdominal pain that wakes you in the night. °· You have abdominal pain that is worsened or improved by eating food. °· You have abdominal pain that is worsened with eating fatty foods. °· You have a fever. °SEEK IMMEDIATE MEDICAL CARE IF:  °· Your pain does not go away within 2 hours. °· You keep throwing up (vomiting). °· Your pain is felt only in portions of the abdomen, such as the right side or the left lower portion of the abdomen. °· You pass bloody or black tarry stools. °MAKE SURE YOU: °· Understand these instructions.   °· Will watch your condition.   °· Will get help right away if you are not doing well or get worse.   °Document Released: 08/04/2005 Document Revised: 10/30/2013 Document Reviewed: 07/04/2013 °ExitCare® Patient Information  ©2015 [employer]. This information is not intended to replace advice given to you by your health care provider. Make sure you discuss any questions you have with your health care provider. °Urinary Tract Infection °Urinary tract infections (UTIs) can develop anywhere along your urinary tract. Your urinary tract is your body's drainage system for removing wastes and extra water. Your urinary tract includes two kidneys, two ureters, a bladder, and a urethra. Your kidneys are a pair of bean-shaped organs. Each kidney is about the size of your fist. They are located below your ribs, one on each side of your spine. °CAUSES °Infections are caused by microbes, which are microscopic organisms, including fungi, viruses, and bacteria. These organisms are so small that they can only be seen through a microscope. Bacteria are the microbes that most commonly cause UTIs. °SYMPTOMS  °Symptoms of UTIs may vary by age and gender of the patient and by the location of the infection. Symptoms in young women typically include a frequent and intense urge to urinate and a painful, burning feeling in the bladder or urethra during urination. Older women and men are more likely to be tired, shaky, and weak and have muscle aches and abdominal pain. A fever may mean the infection is in your kidneys. Other symptoms of a kidney infection include pain in your back or sides below the ribs, nausea, and vomiting. °DIAGNOSIS °To diagnose a UTI, your caregiver will ask you about your symptoms. Your caregiver also will ask to provide a urine sample. The urine sample   will be tested for bacteria and white blood cells. White blood cells are made by your body to help fight infection. °TREATMENT  °Typically, UTIs can be treated with medication. Because most UTIs are caused by a bacterial infection, they usually can be treated with the use of antibiotics. The choice of antibiotic and length of treatment depend on your symptoms and the type of bacteria  causing your infection. °HOME CARE INSTRUCTIONS °· If you were prescribed antibiotics, take them exactly as your caregiver instructs you. Finish the medication even if you feel better after you have only taken some of the medication. °· Drink enough water and fluids to keep your urine clear or pale yellow. °· Avoid caffeine, tea, and carbonated beverages. They tend to irritate your bladder. °· Empty your bladder often. Avoid holding urine for long periods of time. °· Empty your bladder before and after sexual intercourse. °· After a bowel movement, women should cleanse from front to back. Use each tissue only once. °SEEK MEDICAL CARE IF:  °· You have back pain. °· You develop a fever. °· Your symptoms do not begin to resolve within 3 days. °SEEK IMMEDIATE MEDICAL CARE IF:  °· You have severe back pain or lower abdominal pain. °· You develop chills. °· You have nausea or vomiting. °· You have continued burning or discomfort with urination. °MAKE SURE YOU:  °· Understand these instructions. °· Will watch your condition. °· Will get help right away if you are not doing well or get worse. °Document Released: 08/04/2005 Document Revised: 04/25/2012 Document Reviewed: 12/03/2011 °ExitCare® Patient Information ©2015 [employer]. This information is not intended to replace advice given to you by your health care provider. Make sure you discuss any questions you have with your health care provider. ° °

## 2015-03-02 NOTE — ED Provider Notes (Addendum)
CSN: [id number]     Arrival date & time 03/02/15  1122 History   First MD Initiated Contact with Patient 03/02/15 1143     Chief Complaint  Patient presents with  . Weakness     (Consider location/radiation/quality/duration/timing/severity/associated sxs/prior Treatment) HPI Comments: Patient presents to the ER for evaluation of generalized weakness. Patient reports that she has been feeling badly for some time. She has had poor appetite, has had a 30 pound weight loss since the beginning of this year. She does report that she was recently hospitalized at [facility] for chest pain, has a stress test scheduled for this week. Patient is not expressing any abdominal pain, she does not have vomiting or diarrhea associated with her weight loss. Patient's weakness is generalized, no unilateral weakness. No headaches, blurred vision.   Past Medical History  Diagnosis Date  . Diabetes mellitus without complication    Past Surgical History  Procedure Laterality Date  . Abdominal hysterectomy    . Liver biopsy     No family history on file. History  Substance Use Topics  . Smoking status: Current Every Day Smoker -- 0.50 packs/day for 25 years    Types: Cigarettes  . Smokeless tobacco: Not on file  . Alcohol Use: Yes   OB History    No data available     Review of Systems  Constitutional: Positive for fatigue and unexpected weight change.  All other systems reviewed and are negative.     Allergies  Ivp dye  Home Medications   Prior to Admission medications   Medication Sig Start Date End Date Taking? Authorizing Provider  cephALEXin (KEFLEX) 500 MG capsule Take 1 capsule (500 mg total) by mouth 4 (four) times daily. 03/02/15   Gilda Crease, MD  insulin NPH-regular (NOVOLIN 70/30) (70-30) 100 UNIT/ML injection Inject 40 Units into the skin daily with breakfast.    Historical Provider, MD   BP 142/72 mmHg  Pulse 84  Temp(Src) 98 F (36.7 C) (Oral)  Resp 18   Ht  (1.6 m)  Wt 111 lb (50.349 kg)  BMI 19.67 kg/m2  SpO2 100% Physical Exam  Constitutional: She is oriented to person, place, and time. She appears well-developed and well-nourished. No distress.  HENT:  Head: Normocephalic and atraumatic.  Right Ear: Hearing normal.  Left Ear: Hearing normal.  Nose: Nose normal.  Mouth/Throat: Oropharynx is clear and moist and mucous membranes are normal.  Eyes: Conjunctivae and EOM are normal. Pupils are equal, round, and reactive to light.  Neck: Normal range of motion. Neck supple.  Cardiovascular: Regular rhythm, S1 normal and S2 normal.  Exam reveals no gallop and no friction rub.   No murmur heard. Pulmonary/Chest: Effort normal and breath sounds normal. No respiratory distress. She exhibits no tenderness.  Abdominal: Soft. Normal appearance and bowel sounds are normal. There is no hepatosplenomegaly. There is no tenderness. There is no rebound, no guarding, no tenderness at McBurney's point and negative Murphy's sign. No hernia.  Musculoskeletal: Normal range of motion.  Neurological: She is alert and oriented to person, place, and time. She has normal strength. No cranial nerve deficit or sensory deficit. Coordination normal. GCS eye subscore is 4. GCS verbal subscore is 5. GCS motor subscore is 6.  Skin: Skin is warm, dry and intact. No rash noted. No cyanosis.  Psychiatric: She has a normal mood and affect. Her speech is normal and behavior is normal. Thought content normal.  Nursing note and vitals reviewed.  ED Course  Procedures (including critical care time) Labs Review Labs Reviewed  CBC WITH DIFFERENTIAL/PLATELET - Abnormal; Notable for the following:    Platelets 132 (*)    Neutrophils Relative % 31 (*)    Lymphocytes Relative 49 (*)    Eosinophils Relative 9 (*)    All other components within normal limits  COMPREHENSIVE METABOLIC PANEL - Abnormal; Notable for the following:    Sodium 133 (*)    Glucose, Bld 352 (*)     Total Protein 9.3 (*)    Albumin 3.2 (*)    AST 89 (*)    ALT 127 (*)    Alkaline Phosphatase 201 (*)    GFR calc non Af Amer 60 (*)    GFR calc Af Amer 70 (*)    All other components within normal limits  URINALYSIS, ROUTINE W REFLEX MICROSCOPIC - Abnormal; Notable for the following:    Glucose, UA >1000 (*)    Urobilinogen, UA 2.0 (*)    Leukocytes, UA MODERATE (*)    All other components within normal limits  URINE MICROSCOPIC-ADD ON - Abnormal; Notable for the following:    Bacteria, UA MANY (*)    Casts HYALINE CASTS (*)    All other components within normal limits  CBG MONITORING, ED - Abnormal; Notable for the following:    Glucose-Capillary 338 (*)    All other components within normal limits  CBG MONITORING, ED - Abnormal; Notable for the following:    Glucose-Capillary 301 (*)    All other components within normal limits  LIPASE, BLOOD  TROPONIN I  HIV ANTIBODY (ROUTINE TESTING)    Imaging Review Ct Abdomen Pelvis Wo Contrast  03/02/2015   CLINICAL DATA:  Weakness, 30 lb weight loss  EXAM: CT ABDOMEN AND PELVIS WITHOUT CONTRAST  TECHNIQUE: Multidetector CT imaging of the abdomen and pelvis was performed following the standard protocol without IV contrast.  COMPARISON:  06/04/2013  FINDINGS: Lower chest:  Lung bases are clear.  Hepatobiliary: Unenhanced liver and gallbladder appear normal.  Pancreas: Normal  Spleen: Normal  Adrenals/Urinary Tract: Adrenal glands appear normal. 1-2 mm nonobstructing bilateral renal calculi are identified without hydroureteronephrosis. No ureteral calculus or bladder calculus is identified. Bladder physiologically distended but otherwise unremarkable.  Stomach/Bowel: Appendix is normal. No bowel wall thickening or focal segmental dilatation is identified.  Vascular/Lymphatic: Moderate atheromatous aortic calcification without aneurysm. No lymphadenopathy.  Other: Uterus presumed surgically absent. Neither ovary visualized but no adnexal mass  is seen.  Musculoskeletal: Degenerative change in the lumbar spine noted, predominantly at L5-S1. No acute osseous abnormality.  IMPRESSION: No acute intra-abdominal or pelvic pathology. No specific etiology at noncontrast imaging to explain the provided clinical history of weight loss.   Electronically Signed   By: Christiana Pellant M.D.   On: 03/02/2015 15:23     EKG Interpretation   Date/Time:  Sunday March 02 2015 12:15:03 EDT Ventricular Rate:  70 PR Interval:  128 QRS Duration: 74 QT Interval:  396 QTC Calculation: 427 R Axis:   84 Text Interpretation:  Normal sinus rhythm with sinus arrhythmia Normal ECG  Confirmed by POLLINA  MD, CHRISTOPHER [phone]) on 03/02/2015 12:25:36 PM      MDM   Final diagnoses:  Abdominal pain  Weakness  UTI (lower urinary tract infection)    She presents to the ER for evaluation of generalized weakness. She reports that this is been ongoing for some time. She reports that she has had a 30 pound weight loss since  January of this year. Patient was recently hospitalized at [facility]. At that time she was primarily complaining of chest pain. She ruled out from a cardiac standpoint has a stress test scheduled for this week. Patient is complaining of generalized joint pain, mostly in both hips. This is constant and worsens with movement. She denies injury.  Workup here in the ER has been unremarkable. This includes CT of abdomen and pelvis. This was performed because of generalized weight loss and concern over possible occult cancer. No abnormality was noted.  Urinalysis did suggest infection, otherwise workup negative. Patient will be treated with Keflex, encouraged to follow-up with her cardiology appointment.    Gilda Creasehristopher J Pollina, MD 03/02/15 1655  Gilda Creasehristopher J Pollina, MD 03/15/15 [phone]

## 2015-03-02 NOTE — ED Notes (Signed)
Warm blanket provided.

## 2015-03-02 NOTE — ED Notes (Signed)
Patient here with 3 months of bialteral hip pain with increasing weakness and 30lb weight loss since January. Reports that she feels dizzy when she raises her arms, was [facility] for same and no diagnosis

## 2015-03-03 LAB — HIV ANTIBODY (ROUTINE TESTING W REFLEX): HIV Screen 4th Generation wRfx: NONREACTIVE

## 2016-10-30 IMAGING — CT CT ABD-PELV W/O CM
2 of 4 series · 16 of 46 positions shown, 18 images · non-contrast
Comparison: 06/04/2013

CLINICAL DATA: Weakness, 30 lb weight loss

EXAM:
CT ABDOMEN AND PELVIS WITHOUT CONTRAST
TECHNIQUE: Multidetector CT imaging of the abdomen and pelvis was performed
following the standard protocol without IV contrast.

[Series 2: renal stone < 200 lbs 5.0 b31f · axial · 0.63mm/px · z∈[-471,-81]mm · 13 of 86 slices shown, 15 images]
[im 4/86  soft-tissue]
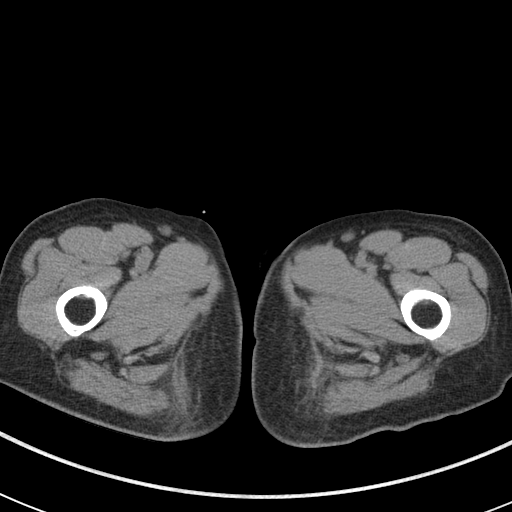
[im 4/86  bone]
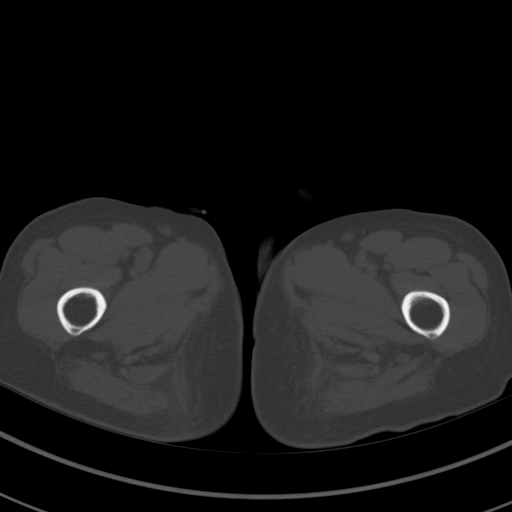
[im 11/86  soft-tissue]
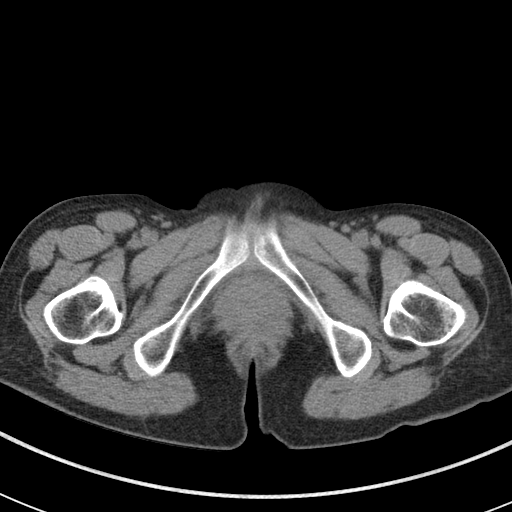
[im 18/86  soft-tissue]
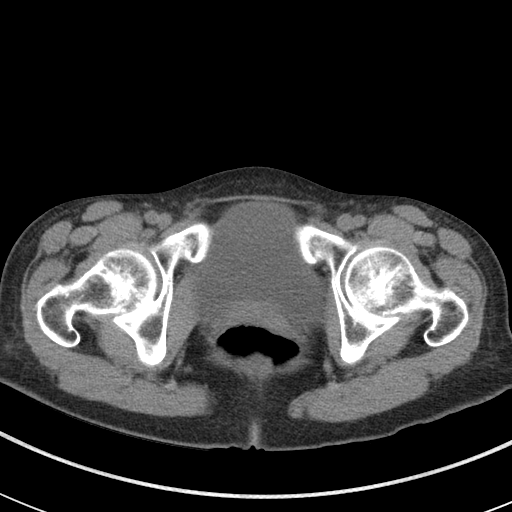
[im 24/86  soft-tissue]
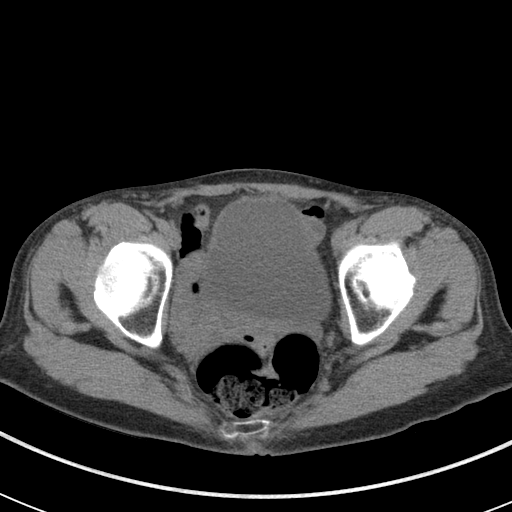
[im 31/86  soft-tissue]
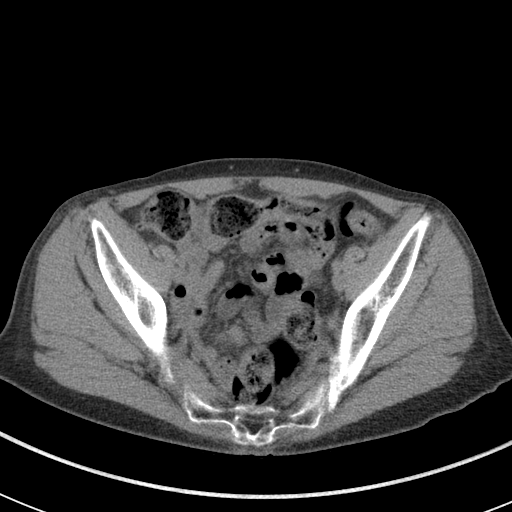
[im 38/86  soft-tissue]
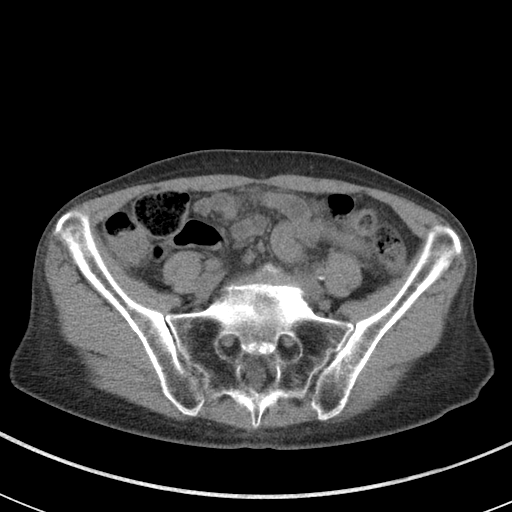
[im 45/86  soft-tissue]
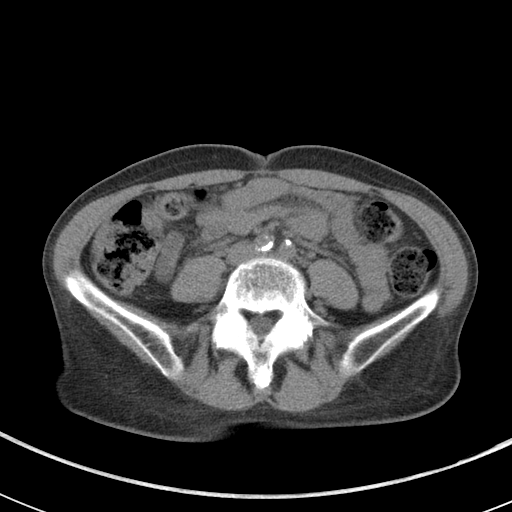
[im 48/86  soft-tissue]
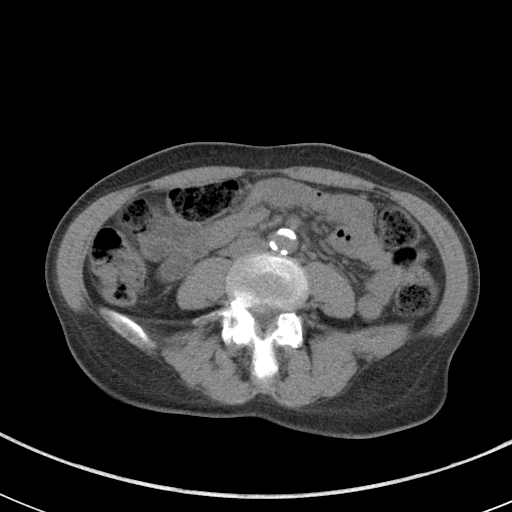
[im 55/86  soft-tissue]
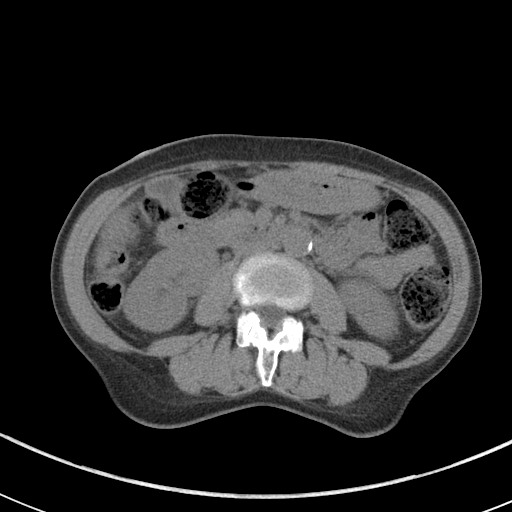
[im 55/86  bone]
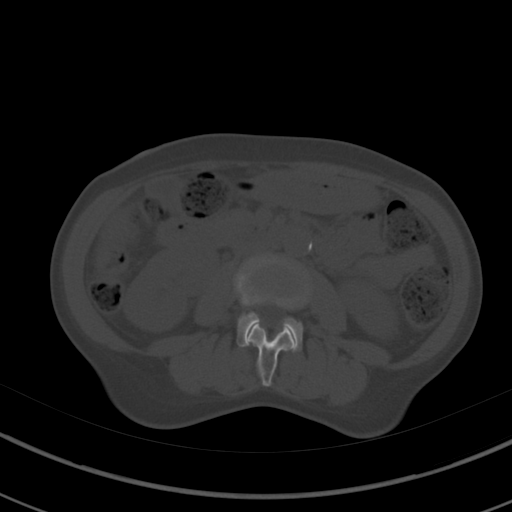
[im 62/86  soft-tissue]
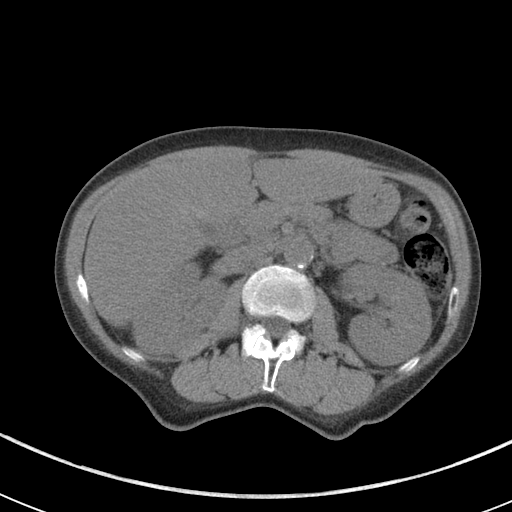
[im 69/86  soft-tissue]
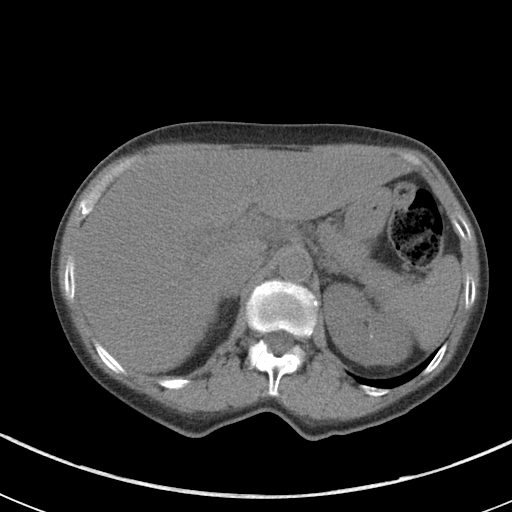
[im 75/86  soft-tissue]
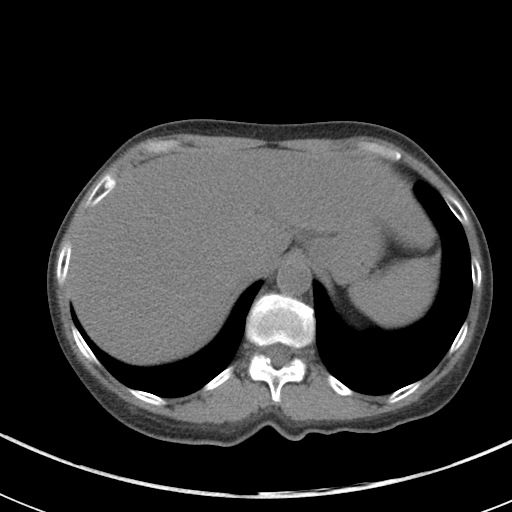
[im 82/86  soft-tissue]
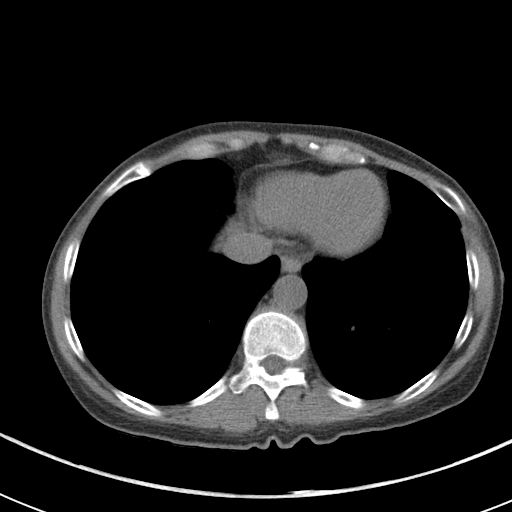

[Series 5: renal stone 3.0 coronal · coronal · 0.72mm/px · 3 of 67 slices shown]
[im 23/67  soft-tissue]
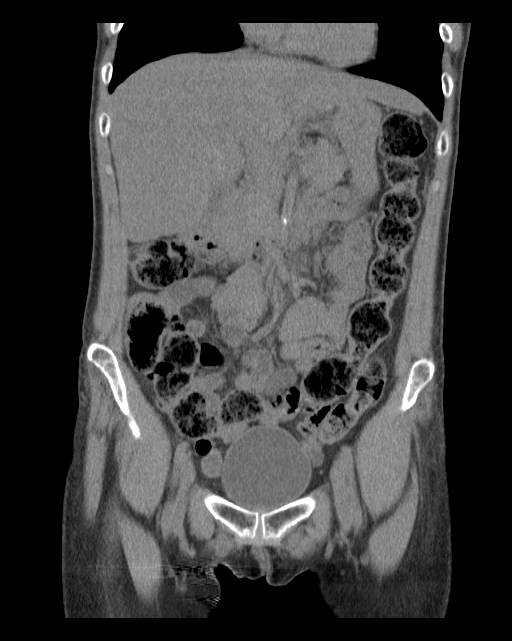
[im 30/67  soft-tissue]
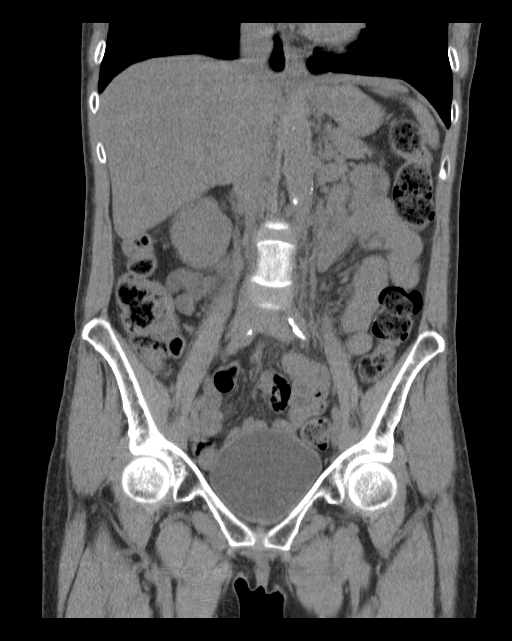
[im 37/67  soft-tissue]
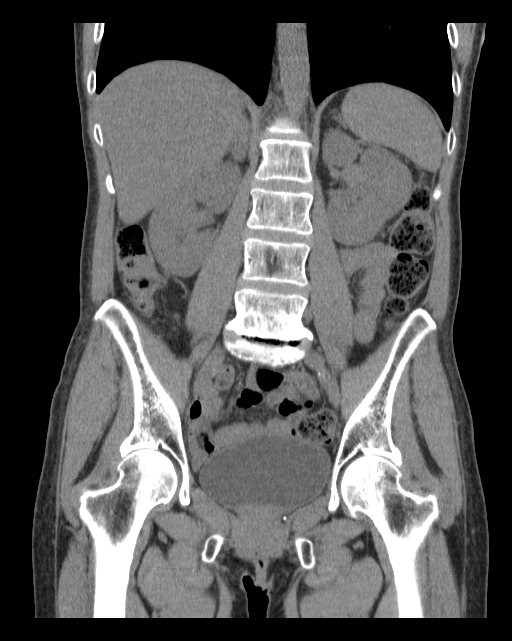

[16 of 46 positions shown; findings below may reference images not displayed]

FINDINGS: Lower chest:  Lung bases are clear.

Hepatobiliary: Unenhanced liver and gallbladder appear normal.

Pancreas: Normal

Spleen: Normal

Adrenals/Urinary Tract: Adrenal glands appear normal. 1-2 mm
nonobstructing bilateral renal calculi are identified without
hydroureteronephrosis. No ureteral calculus or bladder calculus is
identified. Bladder physiologically distended but otherwise
unremarkable.

Stomach/Bowel: Appendix is normal. No bowel wall thickening or focal
segmental dilatation is identified.

Vascular/Lymphatic: Moderate atheromatous aortic calcification
without aneurysm. No lymphadenopathy.

Other: Uterus presumed surgically absent. Neither ovary visualized
but no adnexal mass is seen.

Musculoskeletal: Degenerative change in the lumbar spine noted,
predominantly at L5-S1. No acute osseous abnormality.
IMPRESSION: No acute intra-abdominal or pelvic pathology. No specific etiology
at noncontrast imaging to explain the provided clinical history of
weight loss.
